# Patient Record
Sex: Female | Born: 1995 | Race: White | Hispanic: No | Marital: Single | Smoking: Never smoker
Health system: Southern US, Community
[De-identification: ages and names within clinical notes are randomized; demographics above are authoritative.]

## PROBLEM LIST (undated history)

## (undated) DIAGNOSIS — D649 Anemia, unspecified: Secondary | ICD-10-CM

## (undated) HISTORY — PX: OTHER SURGICAL HISTORY: SHX169

## (undated) HISTORY — DX: Anemia, unspecified: D64.9

---
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
## 2013-06-13 ENCOUNTER — Emergency Department (HOSPITAL_COMMUNITY)
Admission: EM | Admit: 2013-06-13 | Discharge: 2013-06-13 | Disposition: A | Discharge status: Home / Self Care | Payer: 59 | Source: Home / Self Care

## 2013-06-13 ENCOUNTER — Encounter (HOSPITAL_COMMUNITY): Payer: Self-pay | Admitting: Emergency Medicine

## 2013-06-13 DIAGNOSIS — J300 Vasomotor rhinitis: Secondary | ICD-10-CM

## 2013-06-13 DIAGNOSIS — J309 Allergic rhinitis, unspecified: Secondary | ICD-10-CM

## 2013-06-13 MED ORDER — FEXOFENADINE HCL 180 MG PO TABS: 180.0000 mg | ORAL_TABLET | Freq: Every day | ORAL | Status: DC

## 2013-06-13 MED ORDER — IPRATROPIUM BROMIDE 0.06 % NA SOLN: 2.0000 | Freq: Four times a day (QID) | NASAL | Status: DC

## 2013-06-13 NOTE — ED Provider Notes (Signed)
  CSN: [id number]     Arrival date & time 06/13/13  1457 History     None    Chief Complaint  Patient presents with  . URI    sore throat productive cough clear sputum. denies fever n/v/d   (Consider location/radiation/quality/duration/timing/severity/associated sxs/prior Treatment) Patient is a 17 y.o. female presenting with URI. The history is provided by the patient and a parent.  URI Presenting symptoms: congestion, cough, rhinorrhea and sore throat   Presenting symptoms: no fever   Severity:  Mild Onset quality:  Gradual Duration:  5 days Progression:  Unchanged Chronicity:  New Associated symptoms: no headaches, no sinus pain, no sneezing, no swollen glands and no wheezing   Risk factors: sick contacts     History reviewed. No pertinent past medical history. Past Surgical History  Procedure Laterality Date  . Tonsillectomy    . Adenoidectomy    . Arm surgery     History reviewed. No pertinent family history. History  Substance Use Topics  . Smoking status: Never Smoker   . Smokeless tobacco: Not on file  . Alcohol Use: No   OB History   Grav Para Term Preterm Abortions TAB SAB Ect Mult Living                 Review of Systems  Constitutional: Negative.  Negative for fever.  HENT: Positive for congestion, sore throat, rhinorrhea and postnasal drip. Negative for sneezing.   Respiratory: Positive for cough. Negative for shortness of breath and wheezing.   Cardiovascular: Negative.   Neurological: Negative for headaches.    Allergies  Review of patient's allergies indicates no known allergies.  Home Medications   Current Outpatient Rx  Name  Route  Sig  Dispense  Refill  . IRON CR PO   Oral   Take by mouth.          BP 111/63  Pulse 77  Temp(Src) 98.4 F (36.9 C) (Oral)  Resp 14  SpO2 99%  LMP 05/01/2013 Physical Exam  Nursing note and vitals reviewed. Constitutional: She is oriented to person, place, and time. She appears well-developed and  well-nourished.  HENT:  Head: Normocephalic.  Right Ear: External ear normal.  Left Ear: External ear normal.  Nose: Mucosal edema and rhinorrhea present.  Mouth/Throat: Oropharynx is clear and moist.  Neck: Normal range of motion. Neck supple.  Cardiovascular: Normal rate, normal heart sounds and intact distal pulses.   Pulmonary/Chest: Effort normal and breath sounds normal.  Lymphadenopathy:    She has no cervical adenopathy.  Neurological: She is alert and oriented to person, place, and time.  Skin: Skin is warm and dry.  Psychiatric: She has a normal mood and affect.    ED Course   Procedures (including critical care time)  Labs Reviewed - No data to display No results found. No diagnosis found.  MDM    Linna Hoff, MD 06/13/13 1537

## 2013-06-13 NOTE — ED Notes (Signed)
Pt reports sore throat that started on Wednesday morning. Productive cough with clear sputum that started several days before sore throat. Denies fever, n/v/d. Pt states that she was recently around family member that was sick. Pt denies sob chest pain. Pt is sitting up right no signs of respiratory distress.

## 2013-10-31 ENCOUNTER — Other Ambulatory Visit (HOSPITAL_COMMUNITY): Payer: Self-pay

## 2013-11-01 ENCOUNTER — Encounter (HOSPITAL_COMMUNITY)
Admission: RE | Admit: 2013-11-01 | Discharge: 2013-11-01 | Disposition: A | Discharge status: Home / Self Care | Payer: 59 | Source: Ambulatory Visit | Attending: Obstetrics and Gynecology | Admitting: Obstetrics and Gynecology

## 2013-11-01 MED ORDER — FERUMOXYTOL INJECTION 510 MG/17 ML
INTRAVENOUS | Status: AC
  Filled 2013-11-01: qty 17

## 2013-11-01 MED ORDER — FERUMOXYTOL INJECTION 510 MG/17 ML
510.0000 mg | Freq: Once | INTRAVENOUS | Status: AC
  Administered 2013-11-01: 510 mg via INTRAVENOUS

## 2013-11-04 ENCOUNTER — Encounter (HOSPITAL_COMMUNITY)
Admission: RE | Admit: 2013-11-04 | Discharge: 2013-11-04 | Disposition: A | Discharge status: Home / Self Care | Payer: 59 | Source: Ambulatory Visit | Attending: Obstetrics and Gynecology | Admitting: Obstetrics and Gynecology

## 2013-11-04 MED ORDER — FERUMOXYTOL INJECTION 510 MG/17 ML
510.0000 mg | Freq: Once | INTRAVENOUS | Status: AC
  Administered 2013-11-04: 510 mg via INTRAVENOUS

## 2013-11-04 MED ORDER — FERUMOXYTOL INJECTION 510 MG/17 ML
INTRAVENOUS | Status: AC
  Filled 2013-11-04: qty 17

## 2014-03-13 LAB — CBC AND DIFFERENTIAL: WBC: 5.3 10^3/mL

## 2015-02-14 ENCOUNTER — Telehealth: Payer: Self-pay | Admitting: Family

## 2015-02-14 DIAGNOSIS — G43809 Other migraine, not intractable, without status migrainosus: Secondary | ICD-10-CM

## 2015-02-14 NOTE — Progress Notes (Signed)
Based on what you shared with me it looks like you have a serious condition that should be evaluated in a face to face office visit.  If you are having a true medical emergency please call 911.  If you need an urgent face to face visit, [facility] has four urgent care centers for your convenience.  . [facility] a Provider at this Location  [address] [employer] [place], [place] [address] . 8 am to 8 pm Monday-Friday . 9 am to 7 pm Saturday-Sunday  . [facility] [place] a Provider at this Location  [address] [place], [place], [place] [address] . 8 am to 8 pm Monday-Friday . 9 am to 6 pm Saturday . 11 am to 6 pm Sunday   . [facility] [place] Get Driving Directions  [address] [place], [place] [address] . 8 am to 8 pm Monday-Friday . 9 am to 4 pm Saturday-Sunday   . [facility] (a walk in primary care provider)  [place] a Provider at this Location  [place],  [address] . 8 am to 8:30 pm Monday-Thursday . 8 am to 6 pm Friday . 8 am to 4 pm Saturday-Sunday   Your e-visit answers were reviewed by a board certified advanced clinical practitioner to complete your personal care plan.  Depending on the condition, your plan could have included both over the counter or prescription medications.  You will get an e-mail in the next two days asking about your experience.  I hope that your e-visit has been valuable and will speed your recovery . Thank you for choosing an e-visit.

## 2015-05-28 ENCOUNTER — Ambulatory Visit (INDEPENDENT_AMBULATORY_CARE_PROVIDER_SITE_OTHER): Payer: 59 | Admitting: Podiatry

## 2015-05-28 VITALS — BP 102/63 | HR 86 | Resp 16 | Ht 69.0 in | Wt 160.0 lb

## 2015-05-28 DIAGNOSIS — L600 Ingrowing nail: Secondary | ICD-10-CM

## 2015-05-28 LAB — HM PAP SMEAR

## 2015-05-28 MED ORDER — NEOMYCIN-POLYMYXIN-HC 3.5-10000-1 OT SOLN: OTIC | Status: DC

## 2015-05-28 NOTE — Patient Instructions (Signed)

## 2015-05-28 NOTE — Progress Notes (Signed)
   Subjective:    Patient ID: Holly Delgado, female    DOB: Nov 04, 1995, 19 y.o.   MRN: [id number]  HPI INGROWN TOENAIL RIGHT GREAT LATERAL CORNER. HAVE HAD IT GOING ON SINCE July 2 OR 3 , BEEN ON TWO ANTIBIOTICS AND HAVE SOAKED IT .    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 Chief complaint of a painful ingrown toenail fibular border of the hallux right that has been present since the beginning of July. She has tried 2 rounds of antibiotics. She states it is no better.  I have reviewed her past medical history medications allergies surgery social history and review of systems. Pulses are strongly palpable bilaterally neurologic sensorium is intact bilateral D tendon reflexes are brisk and equal bilateral as our muscle strength +5 over 5 dorsiflexion plantar flexors and inverters everters all edges of musculature is intact. Orthopedic evaluation due to straights all joints distal to the ankle for range of motion without crepitation. Cutaneous evaluation demonstrates supple well-hydrated cutis no erythema edema saline as drainage or odor with exception of the fibular border of the hallux right with a sharp incurvated nail margin granulation tissue purulence and malodor.        Assessment & Plan:  Assessment: Ingrown nail paronychia abscess hallux right.  Plan: Discussed etiology pathology conservative versus surgical therapies. Chemical destruction was performed along the fibular border of the hallux right which she tolerated well after local anesthesia was administered. After local anesthesia was performed matrixectomy was performed of which she tolerated this procedure well. She will start soaking in Betadine and warm water starting tomorrow and apply Cortisporin otic drops for which she was given a prescription. She was given both oral and written home-going instructions for the care and soaking  of the foot I will follow-up with her in 1 week.

## 2015-06-04 ENCOUNTER — Ambulatory Visit (INDEPENDENT_AMBULATORY_CARE_PROVIDER_SITE_OTHER): Payer: 59 | Admitting: Podiatry

## 2015-06-04 ENCOUNTER — Encounter: Payer: Self-pay | Admitting: Podiatry

## 2015-06-04 DIAGNOSIS — L03011 Cellulitis of right finger: Secondary | ICD-10-CM

## 2015-06-04 DIAGNOSIS — L03031 Cellulitis of right toe: Secondary | ICD-10-CM

## 2015-06-04 DIAGNOSIS — L600 Ingrowing nail: Secondary | ICD-10-CM

## 2015-06-04 MED ORDER — CEPHALEXIN 500 MG PO CAPS: 500.0000 mg | ORAL_CAPSULE | Freq: Three times a day (TID) | ORAL | Status: DC

## 2015-06-04 NOTE — Patient Instructions (Signed)

## 2015-06-04 NOTE — Progress Notes (Signed)
She presents today for follow-up of matrixectomy fibular border of the hallux right. She states this seems to be doing pretty good and feels better than it did previously. She states that she's been soaking on a regular basis. She does relate changing apartments this weekend.  Objective: Vital signs are stable she is alert and oriented 3. The hallux appears to be slightly reddened with some granulation tissue along the fibular border of the hallux right. It is nontender on palpation however. I see no purulence and I smell no odor.  Assessment: Mild paronychia status post matrixectomy hallux right.  Plan: Discussed etiology pathology conservative versus surgical therapies. At this point we wrote another prescription for Keflex 500 mg 1 by mouth 3 times a day. She will discontinue Betadine spell with Epsom salts and warm water soaks covered during the day and leave open at night. I will to follow up with her to make sure this is healing well in approximately 3 weeks.

## 2015-06-25 ENCOUNTER — Encounter: Payer: Self-pay | Admitting: Podiatry

## 2015-06-25 ENCOUNTER — Ambulatory Visit (INDEPENDENT_AMBULATORY_CARE_PROVIDER_SITE_OTHER): Payer: 59 | Admitting: Podiatry

## 2015-06-25 VITALS — BP 121/80 | HR 100 | Resp 12

## 2015-06-25 DIAGNOSIS — L03031 Cellulitis of right toe: Secondary | ICD-10-CM | POA: Diagnosis not present

## 2015-06-25 DIAGNOSIS — L600 Ingrowing nail: Secondary | ICD-10-CM

## 2015-06-25 DIAGNOSIS — L03011 Cellulitis of right finger: Secondary | ICD-10-CM

## 2015-06-25 NOTE — Progress Notes (Signed)
She presents today for follow-up of the hallux matrixectomy right. She states that she just returned from [place] where she was not able to soak the toe regularly. She states that it seems to be doing a little better.  Objective: Vital signs are stable alert and oriented 3. Minimal edema no erythema cellulitis drainage or odor. Eschar is present along the fibular border pulses are palpable and neurologic sensorium is intact.  Assessment: Slow healing matrixectomy hallux right secondary to noncompliance.  Plan: Continue the use of Epsom salts and warm water along with the prescribed Cortisporin Otic until the wound is completely healed. We will to see no erythema no drainage and no pain. She will cover during the day and leave open at night. Follow up with me as needed. She will watch for signs and symptoms of worsening infection notify as with questions or concerns.  Roselind Messier DPM

## 2016-01-10 ENCOUNTER — Ambulatory Visit: Payer: 59 | Admitting: Family Medicine

## 2016-01-11 MED FILL — LO LOESTRIN FE 1-10 TABLET: 1 MG-10 MCG | 84 days supply | Qty: 84 | Fill #0 | Status: TO

## 2016-02-14 MED FILL — FERRAPLUS 90 TABLET: 90-1 | 90 days supply | Qty: 90 | Fill #2

## 2016-02-15 ENCOUNTER — Ambulatory Visit: Payer: 59 | Admitting: Family Medicine

## 2016-03-25 DIAGNOSIS — Z79899 Other long term (current) drug therapy: Secondary | ICD-10-CM | POA: Diagnosis not present

## 2016-03-25 DIAGNOSIS — F419 Anxiety disorder, unspecified: Secondary | ICD-10-CM | POA: Diagnosis not present

## 2016-03-25 DIAGNOSIS — F902 Attention-deficit hyperactivity disorder, combined type: Secondary | ICD-10-CM | POA: Diagnosis not present

## 2016-03-25 MED FILL — VYVANSE 50 MG CAPSULE: 50 | 90 days supply | Qty: 90 | Fill #0

## 2016-03-27 ENCOUNTER — Ambulatory Visit (INDEPENDENT_AMBULATORY_CARE_PROVIDER_SITE_OTHER): Payer: 59 | Admitting: Family Medicine

## 2016-03-27 ENCOUNTER — Encounter: Payer: Self-pay | Admitting: Family Medicine

## 2016-03-27 VITALS — BP 98/62 | HR 98 | Temp 98.3°F | Ht 68.31 in | Wt 171.0 lb

## 2016-03-27 DIAGNOSIS — Z0000 Encounter for general adult medical examination without abnormal findings: Secondary | ICD-10-CM

## 2016-03-27 DIAGNOSIS — D649 Anemia, unspecified: Secondary | ICD-10-CM | POA: Insufficient documentation

## 2016-03-27 MED FILL — LO LOESTRIN FE 1-10 TABLET: 1 MG-10 MCG | 84 days supply | Qty: 84 | Fill #0

## 2016-03-27 NOTE — Patient Instructions (Signed)
Preventive Care for Adults, Female A healthy lifestyle and preventive care can promote health and wellness. Preventive health guidelines for women include the following key practices.  A routine yearly physical is a good way to check with your health care provider about your health and preventive screening. It is a chance to share any concerns and updates on your health and to receive a thorough exam.  Visit your dentist for a routine exam and preventive care every 6 months. Brush your teeth twice a day and floss once a day. Good oral hygiene prevents tooth decay and gum disease.  The frequency of eye exams is based on your age, health, family medical history, use of contact lenses, and other factors. Follow your health care provider's recommendations for frequency of eye exams.  Eat a healthy diet. Foods like vegetables, fruits, whole grains, low-fat dairy products, and lean protein foods contain the nutrients you need without too many calories. Decrease your intake of foods high in solid fats, added sugars, and salt. Eat the right amount of calories for you.Get information about a proper diet from your health care provider, if necessary.  Regular physical exercise is one of the most important things you can do for your health. Most adults should get at least 150 minutes of moderate-intensity exercise (any activity that increases your heart rate and causes you to sweat) each week. In addition, most adults need muscle-strengthening exercises on 2 or more days a week.  Maintain a healthy weight. The body mass index (BMI) is a screening tool to identify possible weight problems. It provides an estimate of body fat based on height and weight. Your health care provider can find your BMI and can help you achieve or maintain a healthy weight.For adults 20 years and older:  A BMI below 18.5 is considered underweight.  A BMI of 18.5 to 24.9 is normal.  A BMI of 25 to 29.9 is considered overweight.  A  BMI of 30 and above is considered obese.  Maintain normal blood lipids and cholesterol levels by exercising and minimizing your intake of saturated fat. Eat a balanced diet with plenty of fruit and vegetables. Blood tests for lipids and cholesterol should begin at age 45 and be repeated every 5 years. If your lipid or cholesterol levels are high, you are over 50, or you are at high risk for heart disease, you may need your cholesterol levels checked more frequently.Ongoing high lipid and cholesterol levels should be treated with medicines if diet and exercise are not working.  If you smoke, find out from your health care provider how to quit. If you do not use tobacco, do not start.  Lung cancer screening is recommended for adults aged 45-80 years who are at high risk for developing lung cancer because of a history of smoking. A yearly low-dose CT scan of the lungs is recommended for people who have at least a 30-pack-year history of smoking and are a current smoker or have quit within the past 15 years. A pack year of smoking is smoking an average of 1 pack of cigarettes a day for 1 year (for example: 1 pack a day for 30 years or 2 packs a day for 15 years). Yearly screening should continue until the smoker has stopped smoking for at least 15 years. Yearly screening should be stopped for people who develop a health problem that would prevent them from having lung cancer treatment.  If you are pregnant, do not drink alcohol. If you are  breastfeeding, be very cautious about drinking alcohol. If you are not pregnant and choose to drink alcohol, do not have more than 1 drink per day. One drink is considered to be 12 ounces (355 mL) of beer, 5 ounces (148 mL) of wine, or 1.5 ounces (44 mL) of liquor.  Avoid use of street drugs. Do not share needles with anyone. Ask for help if you need support or instructions about stopping the use of drugs.  High blood pressure causes heart disease and increases the risk  of stroke. Your blood pressure should be checked at least every 1 to 2 years. Ongoing high blood pressure should be treated with medicines if weight loss and exercise do not work.  If you are 55-79 years old, ask your health care provider if you should take aspirin to prevent strokes.  Diabetes screening is done by taking a blood sample to check your blood glucose level after you have not eaten for a certain period of time (fasting). If you are not overweight and you do not have risk factors for diabetes, you should be screened once every 3 years starting at age 45. If you are overweight or obese and you are 40-70 years of age, you should be screened for diabetes every year as part of your cardiovascular risk assessment.  Breast cancer screening is essential preventive care for women. You should practice "breast self-awareness." This means understanding the normal appearance and feel of your breasts and may include breast self-examination. Any changes detected, no matter how small, should be reported to a health care provider. Women in their 20s and 30s should have a clinical breast exam (CBE) by a health care provider as part of a regular health exam every 1 to 3 years. After age 40, women should have a CBE every year. Starting at age 40, women should consider having a mammogram (breast X-ray test) every year. Women who have a family history of breast cancer should talk to their health care provider about genetic screening. Women at a high risk of breast cancer should talk to their health care providers about having an MRI and a mammogram every year.  Breast cancer gene (BRCA)-related cancer risk assessment is recommended for women who have family members with BRCA-related cancers. BRCA-related cancers include breast, ovarian, tubal, and peritoneal cancers. Having family members with these cancers may be associated with an increased risk for harmful changes (mutations) in the breast cancer genes BRCA1 and  BRCA2. Results of the assessment will determine the need for genetic counseling and BRCA1 and BRCA2 testing.  Your health care provider may recommend that you be screened regularly for cancer of the pelvic organs (ovaries, uterus, and vagina). This screening involves a pelvic examination, including checking for microscopic changes to the surface of your cervix (Pap test). You may be encouraged to have this screening done every 3 years, beginning at age 21.  For women ages 30-65, health care providers may recommend pelvic exams and Pap testing every 3 years, or they may recommend the Pap and pelvic exam, combined with testing for human papilloma virus (HPV), every 5 years. Some types of HPV increase your risk of cervical cancer. Testing for HPV may also be done on women of any age with unclear Pap test results.  Other health care providers may not recommend any screening for nonpregnant women who are considered low risk for pelvic cancer and who do not have symptoms. Ask your health care provider if a screening pelvic exam is right for   you.  If you have had past treatment for cervical cancer or a condition that could lead to cancer, you need Pap tests and screening for cancer for at least 20 years after your treatment. If Pap tests have been discontinued, your risk factors (such as having a new sexual partner) need to be reassessed to determine if screening should resume. Some women have medical problems that increase the chance of getting cervical cancer. In these cases, your health care provider may recommend more frequent screening and Pap tests.  Colorectal cancer can be detected and often prevented. Most routine colorectal cancer screening begins at the age of 50 years and continues through age 75 years. However, your health care provider may recommend screening at an earlier age if you have risk factors for colon cancer. On a yearly basis, your health care provider may provide home test kits to check  for hidden blood in the stool. Use of a small camera at the end of a tube, to directly examine the colon (sigmoidoscopy or colonoscopy), can detect the earliest forms of colorectal cancer. Talk to your health care provider about this at age 50, when routine screening begins. Direct exam of the colon should be repeated every 5-10 years through age 75 years, unless early forms of precancerous polyps or small growths are found.  People who are at an increased risk for hepatitis B should be screened for this virus. You are considered at high risk for hepatitis B if:  You were born in a country where hepatitis B occurs often. Talk with your health care provider about which countries are considered high risk.  Your parents were born in a high-risk country and you have not received a shot to protect against hepatitis B (hepatitis B vaccine).  You have HIV or AIDS.  You use needles to inject street drugs.  You live with, or have sex with, someone who has hepatitis B.  You get hemodialysis treatment.  You take certain medicines for conditions like cancer, organ transplantation, and autoimmune conditions.  Hepatitis C blood testing is recommended for all people born from 1945 through 1965 and any individual with known risks for hepatitis C.  Practice safe sex. Use condoms and avoid high-risk sexual practices to reduce the spread of sexually transmitted infections (STIs). STIs include gonorrhea, chlamydia, syphilis, trichomonas, herpes, HPV, and human immunodeficiency virus (HIV). Herpes, HIV, and HPV are viral illnesses that have no cure. They can result in disability, cancer, and death.  You should be screened for sexually transmitted illnesses (STIs) including gonorrhea and chlamydia if:  You are sexually active and are younger than 24 years.  You are older than 24 years and your health care provider tells you that you are at risk for this type of infection.  Your sexual activity has changed  since you were last screened and you are at an increased risk for chlamydia or gonorrhea. Ask your health care provider if you are at risk.  If you are at risk of being infected with HIV, it is recommended that you take a prescription medicine daily to prevent HIV infection. This is called preexposure prophylaxis (PrEP). You are considered at risk if:  You are sexually active and do not regularly use condoms or know the HIV status of your partner(s).  You take drugs by injection.  You are sexually active with a partner who has HIV.  Talk with your health care provider about whether you are at high risk of being infected with HIV. If   you choose to begin PrEP, you should first be tested for HIV. You should then be tested every 3 months for as long as you are taking PrEP.  Osteoporosis is a disease in which the bones lose minerals and strength with aging. This can result in serious bone fractures or breaks. The risk of osteoporosis can be identified using a bone density scan. Women ages 67 years and over and women at risk for fractures or osteoporosis should discuss screening with their health care providers. Ask your health care provider whether you should take a calcium supplement or vitamin D to reduce the rate of osteoporosis.  Menopause can be associated with physical symptoms and risks. Hormone replacement therapy is available to decrease symptoms and risks. You should talk to your health care provider about whether hormone replacement therapy is right for you.  Use sunscreen. Apply sunscreen liberally and repeatedly throughout the day. You should seek shade when your shadow is shorter than you. Protect yourself by wearing long sleeves, pants, a wide-brimmed hat, and sunglasses year round, whenever you are outdoors.  Once a month, do a whole body skin exam, using a mirror to look at the skin on your back. Tell your health care provider of new moles, moles that have irregular borders, moles that  are larger than a pencil eraser, or moles that have changed in shape or color.  Stay current with required vaccines (immunizations).  Influenza vaccine. All adults should be immunized every year.  Tetanus, diphtheria, and acellular pertussis (Td, Tdap) vaccine. Pregnant women should receive 1 dose of Tdap vaccine during each pregnancy. The dose should be obtained regardless of the length of time since the last dose. Immunization is preferred during the 27th-36th week of gestation. An adult who has not previously received Tdap or who does not know her vaccine status should receive 1 dose of Tdap. This initial dose should be followed by tetanus and diphtheria toxoids (Td) booster doses every 10 years. Adults with an unknown or incomplete history of completing a 3-dose immunization series with Td-containing vaccines should begin or complete a primary immunization series including a Tdap dose. Adults should receive a Td booster every 10 years.  Varicella vaccine. An adult without evidence of immunity to varicella should receive 2 doses or a second dose if she has previously received 1 dose. Pregnant females who do not have evidence of immunity should receive the first dose after pregnancy. This first dose should be obtained before leaving the health care facility. The second dose should be obtained 4-8 weeks after the first dose.  Human papillomavirus (HPV) vaccine. Females aged 13-26 years who have not received the vaccine previously should obtain the 3-dose series. The vaccine is not recommended for use in pregnant females. However, pregnancy testing is not needed before receiving a dose. If a female is found to be pregnant after receiving a dose, no treatment is needed. In that case, the remaining doses should be delayed until after the pregnancy. Immunization is recommended for any person with an immunocompromised condition through the age of 61 years if she did not get any or all doses earlier. During the  3-dose series, the second dose should be obtained 4-8 weeks after the first dose. The third dose should be obtained 24 weeks after the first dose and 16 weeks after the second dose.  Zoster vaccine. One dose is recommended for adults aged 30 years or older unless certain conditions are present.  Measles, mumps, and rubella (MMR) vaccine. Adults born  before 1957 generally are considered immune to measles and mumps. Adults born in 1957 or later should have 1 or more doses of MMR vaccine unless there is a contraindication to the vaccine or there is laboratory evidence of immunity to each of the three diseases. A routine second dose of MMR vaccine should be obtained at least 28 days after the first dose for students attending postsecondary schools, health care workers, or international travelers. People who received inactivated measles vaccine or an unknown type of measles vaccine during 1963-1967 should receive 2 doses of MMR vaccine. People who received inactivated mumps vaccine or an unknown type of mumps vaccine before 1979 and are at high risk for mumps infection should consider immunization with 2 doses of MMR vaccine. For females of childbearing age, rubella immunity should be determined. If there is no evidence of immunity, females who are not pregnant should be vaccinated. If there is no evidence of immunity, females who are pregnant should delay immunization until after pregnancy. Unvaccinated health care workers born before 1957 who lack laboratory evidence of measles, mumps, or rubella immunity or laboratory confirmation of disease should consider measles and mumps immunization with 2 doses of MMR vaccine or rubella immunization with 1 dose of MMR vaccine.  Pneumococcal 13-valent conjugate (PCV13) vaccine. When indicated, a person who is uncertain of his immunization history and has no record of immunization should receive the PCV13 vaccine. All adults 65 years of age and older should receive this  vaccine. An adult aged 19 years or older who has certain medical conditions and has not been previously immunized should receive 1 dose of PCV13 vaccine. This PCV13 should be followed with a dose of pneumococcal polysaccharide (PPSV23) vaccine. Adults who are at high risk for pneumococcal disease should obtain the PPSV23 vaccine at least 8 weeks after the dose of PCV13 vaccine. Adults older than 22 years of age who have normal immune system function should obtain the PPSV23 vaccine dose at least 1 year after the dose of PCV13 vaccine.  Pneumococcal polysaccharide (PPSV23) vaccine. When PCV13 is also indicated, PCV13 should be obtained first. All adults aged 65 years and older should be immunized. An adult younger than age 65 years who has certain medical conditions should be immunized. Any person who resides in a nursing home or long-term care facility should be immunized. An adult smoker should be immunized. People with an immunocompromised condition and certain other conditions should receive both PCV13 and PPSV23 vaccines. People with human immunodeficiency virus (HIV) infection should be immunized as soon as possible after diagnosis. Immunization during chemotherapy or radiation therapy should be avoided. Routine use of PPSV23 vaccine is not recommended for American Indians, [place] Natives, or people younger than 65 years unless there are medical conditions that require PPSV23 vaccine. When indicated, people who have unknown immunization and have no record of immunization should receive PPSV23 vaccine. One-time revaccination 5 years after the first dose of PPSV23 is recommended for people aged 19-64 years who have chronic kidney failure, nephrotic syndrome, asplenia, or immunocompromised conditions. People who received 1-2 doses of PPSV23 before age 65 years should receive another dose of PPSV23 vaccine at age 65 years or later if at least 5 years have passed since the previous dose. Doses of PPSV23 are not  needed for people immunized with PPSV23 at or after age 65 years.  Meningococcal vaccine. Adults with asplenia or persistent complement component deficiencies should receive 2 doses of quadrivalent meningococcal conjugate (MenACWY-D) vaccine. The doses should be obtained   at least 2 months apart. Microbiologists working with certain meningococcal bacteria, [place] recruits, people at risk during an outbreak, and people who travel to or live in countries with a high rate of meningitis should be immunized. A first-year college student up through age 34 years who is living in a residence hall should receive a dose if she did not receive a dose on or after her 16th birthday. Adults who have certain high-risk conditions should receive one or more doses of vaccine.  Hepatitis A vaccine. Adults who wish to be protected from this disease, have certain high-risk conditions, work with hepatitis A-infected animals, work in hepatitis A research labs, or travel to or work in countries with a high rate of hepatitis A should be immunized. Adults who were previously unvaccinated and who anticipate close contact with an international adoptee during the first 60 days after arrival in the [place] from a country with a high rate of hepatitis A should be immunized.  Hepatitis B vaccine. Adults who wish to be protected from this disease, have certain high-risk conditions, may be exposed to blood or other infectious body fluids, are household contacts or sex partners of hepatitis B positive people, are clients or workers in certain care facilities, or travel to or work in countries with a high rate of hepatitis B should be immunized.  Haemophilus influenzae type b (Hib) vaccine. A previously unvaccinated person with asplenia or sickle cell disease or having a scheduled splenectomy should receive 1 dose of Hib vaccine. Regardless of previous immunization, a recipient of a hematopoietic stem cell transplant should receive a  3-dose series 6-12 months after her successful transplant. Hib vaccine is not recommended for adults with HIV infection. Preventive Services / Frequency Ages 35 to 4 years  Blood pressure check.** / Every 3-5 years.  Lipid and cholesterol check.** / Every 5 years beginning at age 60.  Clinical breast exam.** / Every 3 years for women in their 71s and 10s.  BRCA-related cancer risk assessment.** / For women who have family members with a BRCA-related cancer (breast, ovarian, tubal, or peritoneal cancers).  Pap test.** / Every 2 years from ages 76 through 26. Every 3 years starting at age 61 through age 76 or 93 with a history of 3 consecutive normal Pap tests.  HPV screening.** / Every 3 years from ages 37 through ages 60 to 51 with a history of 3 consecutive normal Pap tests.  Hepatitis C blood test.** / For any individual with known risks for hepatitis C.  Skin self-exam. / Monthly.  Influenza vaccine. / Every year.  Tetanus, diphtheria, and acellular pertussis (Tdap, Td) vaccine.** / Consult your health care provider. Pregnant women should receive 1 dose of Tdap vaccine during each pregnancy. 1 dose of Td every 10 years.  Varicella vaccine.** / Consult your health care provider. Pregnant females who do not have evidence of immunity should receive the first dose after pregnancy.  HPV vaccine. / 3 doses over 6 months, if 93 and younger. The vaccine is not recommended for use in pregnant females. However, pregnancy testing is not needed before receiving a dose.  Measles, mumps, rubella (MMR) vaccine.** / You need at least 1 dose of MMR if you were born in 1957 or later. You may also need a 2nd dose. For females of childbearing age, rubella immunity should be determined. If there is no evidence of immunity, females who are not pregnant should be vaccinated. If there is no evidence of immunity, females who are  pregnant should delay immunization until after pregnancy.  Pneumococcal  13-valent conjugate (PCV13) vaccine.** / Consult your health care provider.  Pneumococcal polysaccharide (PPSV23) vaccine.** / 1 to 2 doses if you smoke cigarettes or if you have certain conditions.  Meningococcal vaccine.** / 1 dose if you are age 68 to 8 years and a Market researcher living in a residence hall, or have one of several medical conditions, you need to get vaccinated against meningococcal disease. You may also need additional booster doses.  Hepatitis A vaccine.** / Consult your health care provider.  Hepatitis B vaccine.** / Consult your health care provider.  Haemophilus influenzae type b (Hib) vaccine.** / Consult your health care provider. Ages 7 to 53 years  Blood pressure check.** / Every year.  Lipid and cholesterol check.** / Every 5 years beginning at age 25 years.  Lung cancer screening. / Every year if you are aged 11-80 years and have a 30-pack-year history of smoking and currently smoke or have quit within the past 15 years. Yearly screening is stopped once you have quit smoking for at least 15 years or develop a health problem that would prevent you from having lung cancer treatment.  Clinical breast exam.** / Every year after age 48 years.  BRCA-related cancer risk assessment.** / For women who have family members with a BRCA-related cancer (breast, ovarian, tubal, or peritoneal cancers).  Mammogram.** / Every year beginning at age 41 years and continuing for as long as you are in good health. Consult with your health care provider.  Pap test.** / Every 3 years starting at age 65 years through age 37 or 70 years with a history of 3 consecutive normal Pap tests.  HPV screening.** / Every 3 years from ages 72 years through ages 60 to 40 years with a history of 3 consecutive normal Pap tests.  Fecal occult blood test (FOBT) of stool. / Every year beginning at age 21 years and continuing until age 5 years. You may not need to do this test if you get  a colonoscopy every 10 years.  Flexible sigmoidoscopy or colonoscopy.** / Every 5 years for a flexible sigmoidoscopy or every 10 years for a colonoscopy beginning at age 35 years and continuing until age 48 years.  Hepatitis C blood test.** / For all people born from [birth year] through 1965 and any individual with known risks for hepatitis C.  Skin self-exam. / Monthly.  Influenza vaccine. / Every year.  Tetanus, diphtheria, and acellular pertussis (Tdap/Td) vaccine.** / Consult your health care provider. Pregnant women should receive 1 dose of Tdap vaccine during each pregnancy. 1 dose of Td every 10 years.  Varicella vaccine.** / Consult your health care provider. Pregnant females who do not have evidence of immunity should receive the first dose after pregnancy.  Zoster vaccine.** / 1 dose for adults aged 30 years or older.  Measles, mumps, rubella (MMR) vaccine.** / You need at least 1 dose of MMR if you were born in 1957 or later. You may also need a second dose. For females of childbearing age, rubella immunity should be determined. If there is no evidence of immunity, females who are not pregnant should be vaccinated. If there is no evidence of immunity, females who are pregnant should delay immunization until after pregnancy.  Pneumococcal 13-valent conjugate (PCV13) vaccine.** / Consult your health care provider.  Pneumococcal polysaccharide (PPSV23) vaccine.** / 1 to 2 doses if you smoke cigarettes or if you have certain conditions.  Meningococcal vaccine.** /  Consult your health care provider.  Hepatitis A vaccine.** / Consult your health care provider.  Hepatitis B vaccine.** / Consult your health care provider.  Haemophilus influenzae type b (Hib) vaccine.** / Consult your health care provider. Ages 64 years and over  Blood pressure check.** / Every year.  Lipid and cholesterol check.** / Every 5 years beginning at age 23 years.  Lung cancer screening. / Every year if you  are aged 16-80 years and have a 30-pack-year history of smoking and currently smoke or have quit within the past 15 years. Yearly screening is stopped once you have quit smoking for at least 15 years or develop a health problem that would prevent you from having lung cancer treatment.  Clinical breast exam.** / Every year after age 74 years.  BRCA-related cancer risk assessment.** / For women who have family members with a BRCA-related cancer (breast, ovarian, tubal, or peritoneal cancers).  Mammogram.** / Every year beginning at age 44 years and continuing for as long as you are in good health. Consult with your health care provider.  Pap test.** / Every 3 years starting at age 58 years through age 22 or 39 years with 3 consecutive normal Pap tests. Testing can be stopped between 65 and 70 years with 3 consecutive normal Pap tests and no abnormal Pap or HPV tests in the past 10 years.  HPV screening.** / Every 3 years from ages 64 years through ages 70 or 61 years with a history of 3 consecutive normal Pap tests. Testing can be stopped between 65 and 70 years with 3 consecutive normal Pap tests and no abnormal Pap or HPV tests in the past 10 years.  Fecal occult blood test (FOBT) of stool. / Every year beginning at age 40 years and continuing until age 27 years. You may not need to do this test if you get a colonoscopy every 10 years.  Flexible sigmoidoscopy or colonoscopy.** / Every 5 years for a flexible sigmoidoscopy or every 10 years for a colonoscopy beginning at age 7 years and continuing until age 32 years.  Hepatitis C blood test.** / For all people born from [birth year] through 1965 and any individual with known risks for hepatitis C.  Osteoporosis screening.** / A one-time screening for women ages 30 years and over and women at risk for fractures or osteoporosis.  Skin self-exam. / Monthly.  Influenza vaccine. / Every year.  Tetanus, diphtheria, and acellular pertussis (Tdap/Td)  vaccine.** / 1 dose of Td every 10 years.  Varicella vaccine.** / Consult your health care provider.  Zoster vaccine.** / 1 dose for adults aged 35 years or older.  Pneumococcal 13-valent conjugate (PCV13) vaccine.** / Consult your health care provider.  Pneumococcal polysaccharide (PPSV23) vaccine.** / 1 dose for all adults aged 46 years and older.  Meningococcal vaccine.** / Consult your health care provider.  Hepatitis A vaccine.** / Consult your health care provider.  Hepatitis B vaccine.** / Consult your health care provider.  Haemophilus influenzae type b (Hib) vaccine.** / Consult your health care provider. ** Family history and personal history of risk and conditions may change your health care provider's recommendations.   This information is not intended to replace advice given to you by your health care provider. Make sure you discuss any questions you have with your health care provider.   Document Released: 12/16/2001 Document Revised: 11/10/2014 Document Reviewed: 03/17/2011 Elsevier Interactive Patient Education [employer].

## 2016-03-27 NOTE — Progress Notes (Signed)
Phone: [phone]  Subjective:  Patient presents today to establish care. Chief complaint-noted.   See problem oriented charting  The following were reviewed and entered/updated in epic: Past Medical History  Diagnosis Date  . Anemia     improved on birth control   Patient Active Problem List   Diagnosis Date Noted  . Anemia    Past Surgical History  Procedure Laterality Date  . Tonsillectomy  2001  . Arm surgery      2006 and 2007- rods after fx  . Wisdom tooth extraction  2013    Family History  Problem Relation Age of Onset  . Thyroid cancer Mother   . Polycystic ovary syndrome Sister   . Breast cancer Maternal Grandmother     Medications- reviewed and updated Current Outpatient Prescriptions  Medication Sig Dispense Refill  . Iron-Folic 99991111 (FERRAPLUS 90) 90-1 MG TABS Take 1 tablet by mouth daily.  3  . Norethindrone-Ethinyl Estradiol-Fe Biphas (LO LOESTRIN FE) 1 MG-10 MCG / 10 MCG tablet Take 1 tablet by mouth daily.     No current facility-administered medications for this visit.    Allergies-reviewed and updated No Known Allergies  Social History   Social History  . Marital Status: Single    Spouse Name: N/A  . Number of Children: N/A  . Years of Education: N/A   Social History Main Topics  . Smoking status: Never Smoker   . Smokeless tobacco: Never Used  . Alcohol Use: 0.6 - 1.2 oz/week    1-2 Standard drinks or equivalent per week  . Drug Use: No  . Sexual Activity: Yes    Birth Control/ Protection: Pill   Other Topics Concern  . None   Social History Narrative   Parents are patients at [employer]- unclear which physician.    Single/dating. Lives with roommate in [place] then moving back home.       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  HENT: Negative for hearing loss.   Eyes: Negative for blurred vision and double vision.  Respiratory: Negative for cough and hemoptysis.   Cardiovascular: Negative for chest pain and palpitations.  Gastrointestinal: Negative for heartburn and nausea.  Genitourinary: Negative for dysuria and urgency.  Musculoskeletal: Negative for myalgias and neck pain.  Skin: Negative for itching and rash.  Neurological: Negative for dizziness, tingling and headaches.  Endo/Heme/Allergies: Negative for polydipsia. Does not bruise/bleed easily.  Psychiatric/Behavioral: Negative for substance abuse. The patient is not nervous/anxious.    Objective: BP 98/62 mmHg  Pulse 98  Temp(Src) 98.3 F (36.8 C) (Oral)  Ht 5' 8.31" (1.735 m)  Wt 171 lb (77.565 kg)  BMI 25.77 kg/m2  SpO2 98%  LMP 03/08/2016 (Exact Date) Gen: NAD, resting comfortably HEENT: Mucous membranes are moist. Oropharynx normal. TM normal. Eyes: sclera and lids normal, PERRLA Neck: no thyromegaly, no cervical lymphadenopathy CV: RRR no murmurs rubs or gallops Lungs: CTAB no crackles, wheeze, rhonchi Abdomen: soft/nontender/nondistended/normal bowel sounds. No rebound or guarding.  Ext: no edema Skin: warm, dry Neuro: 5/5 strength in upper and lower extremities, normal gait, normal reflexes  Assessment/Plan:  20 y.o. female presenting for annual physical.  Health Maintenance counseling: 1. Anticipatory guidance: Patient counseled regarding regular dental exams, eye exams- wears contacts, wearing seatbelts.  2. Risk factor reduction:  Advised patient of need for regular exercise and diet rich and fruits and vegetables to reduce risk of heart attack and stroke. Working on 3x a week exercise. Weight up about 11 lbs in last few years- working towards 160 again.  3. Immunizations/screenings/ancillary  studies- getting pap smear, august 2008 Tdap- give Tdap next year.  4. Cervical cancer screening- august 2016 normal, get records 5. Breast cancer screening-  Start at age 27 with MGM history 6. Colon cancer screening - start at age 76 7. Skin cancer screening- no concerning lesions 8. STD screening- may have had with GYN- getting records  ROI- take out labs if done at GYN, look for pap smear.   Return in about 1 year (around 03/27/2017) for physical. Return precautions advised.   Orders Placed This Encounter  Procedures  . CBC    Standing Status: Future     Number of Occurrences:      Standing Expiration Date: 03/27/2017  . Comprehensive metabolic panel    [facility]    Standing Status: Future     Number of Occurrences:      Standing Expiration Date: 03/27/2017  . Lipid panel    Standing Status: Future     Number of Occurrences:      Standing Expiration Date: 03/27/2017  . TSH    Standing Status: Future     Number of Occurrences:      Standing Expiration Date: 03/27/2017  . HIV antibody    Standing Status: Future     Number of Occurrences:      Standing Expiration Date: 03/27/2017  . RPR    [facility]    Standing Status: Future     Number of Occurrences:      Standing Expiration Date: 03/27/2017    Meds ordered this encounter  Medications  . Iron-Folic 99991111 (FERRAPLUS 90) 90-1 MG TABS    Sig: Take 1 tablet by mouth daily.    Refill:  3  . Norethindrone-Ethinyl Estradiol-Fe Biphas (LO LOESTRIN FE) 1 MG-10 MCG / 10 MCG tablet    Sig: Take 1 tablet by mouth daily.    Garret Reddish, MD

## 2016-04-11 ENCOUNTER — Ambulatory Visit (INDEPENDENT_AMBULATORY_CARE_PROVIDER_SITE_OTHER): Payer: 59 | Admitting: Sports Medicine

## 2016-04-11 ENCOUNTER — Other Ambulatory Visit (HOSPITAL_COMMUNITY)
Admission: RE | Admit: 2016-04-11 | Discharge: 2016-04-11 | Disposition: A | Discharge status: Home / Self Care | Payer: 59 | Source: Ambulatory Visit | Attending: Family Medicine | Admitting: Family Medicine

## 2016-04-11 ENCOUNTER — Other Ambulatory Visit (INDEPENDENT_AMBULATORY_CARE_PROVIDER_SITE_OTHER): Payer: 59

## 2016-04-11 ENCOUNTER — Encounter: Payer: Self-pay | Admitting: Sports Medicine

## 2016-04-11 VITALS — BP 111/60 | HR 81 | Ht 69.0 in | Wt 165.0 lb

## 2016-04-11 DIAGNOSIS — Z113 Encounter for screening for infections with a predominantly sexual mode of transmission: Secondary | ICD-10-CM | POA: Diagnosis not present

## 2016-04-11 DIAGNOSIS — N760 Acute vaginitis: Secondary | ICD-10-CM | POA: Diagnosis not present

## 2016-04-11 DIAGNOSIS — Z0000 Encounter for general adult medical examination without abnormal findings: Secondary | ICD-10-CM

## 2016-04-11 DIAGNOSIS — M25561 Pain in right knee: Secondary | ICD-10-CM | POA: Insufficient documentation

## 2016-04-11 LAB — COMPREHENSIVE METABOLIC PANEL
ALBUMIN: 4.4 g/dL (ref 3.5–5.2)
ALT: 12 U/L (ref 0–35)
AST: 17 U/L (ref 0–37)
Alkaline Phosphatase: 44 U/L (ref 39–117)
BILIRUBIN TOTAL: 0.5 mg/dL (ref 0.2–1.2)
BUN: 12 mg/dL (ref 6–23)
CALCIUM: 10 mg/dL (ref 8.4–10.5)
CO2: 30 mEq/L (ref 19–32)
Chloride: 102 mEq/L (ref 96–112)
Creatinine, Ser: 0.83 mg/dL (ref 0.40–1.20)
GFR: 91.32 mL/min (ref >60.00)
Glucose, Bld: 60 mg/dL — ABNORMAL LOW (ref 70–99)
POTASSIUM: 3.8 meq/L (ref 3.5–5.1)
SODIUM: 140 meq/L (ref 135–145)
TOTAL PROTEIN: 7.1 g/dL (ref 6.0–8.3)

## 2016-04-11 LAB — LIPID PANEL
CHOL/HDL RATIO: 3
Cholesterol: 154 mg/dL (ref 0–200)
HDL: 54.3 mg/dL (ref >39.00)
LDL Cholesterol: 83 mg/dL (ref 0–99)
NONHDL: 99.67
Triglycerides: 84 mg/dL (ref 0.0–149.0)
VLDL: 16.8 mg/dL (ref 0.0–40.0)

## 2016-04-11 LAB — TSH: TSH: 0.92 u[IU]/mL (ref 0.35–4.50)

## 2016-04-11 LAB — CBC
HEMATOCRIT: 37.6 % (ref 36.0–46.0)
Hemoglobin: 12.7 g/dL (ref 12.0–15.0)
MCHC: 33.7 g/dL (ref 30.0–36.0)
MCV: 85.5 fl (ref 78.0–100.0)
Platelets: 214 10*3/uL (ref 150.0–400.0)
RBC: 4.4 Mil/uL (ref 3.87–5.11)
RDW: 12.7 % (ref 11.5–15.5)
WBC: 6.5 10*3/uL (ref 4.0–10.5)

## 2016-04-11 MED ORDER — MELOXICAM 15 MG PO TABS: ORAL_TABLET | ORAL | Status: DC

## 2016-04-11 MED FILL — MELOXICAM 15 MG TABLET: 15 | 45 days supply | Qty: 45 | Fill #0

## 2016-04-11 NOTE — Assessment & Plan Note (Signed)
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 We will try conservative measures and see her back in 1 month. If continues would at least get 4 view x-rays standing with tunnel view to evaluate for underlying etiologies. As well getting systemic lab work including inflammatory markers, STD testing or possibly being able to drain the knee if it is inflamed and sent for gonococcal cultures would be reasonable. I highly doubt this is the case but would keep this in the differential. -In the interim start Mobic 15 mg daily for the next month. VMO strengthening exercises explained. Ice after activity.

## 2016-04-11 NOTE — Progress Notes (Signed)
  Mariajulia Whittington - 20 y.o. female MRN [id number]  Date of birth: [birth year]  SUBJECTIVE:  Including CC & ROS.  Momina Nienaber Murgia is a 20 y.o. female who presents today for R knee pain.    Knee Pain R, initial visit 04/11/16 - patient presents today with 2-3 weeks of right anterior knee pain. Denies a specific injury or previously injuring the knee. She denies any locking catching giving way or instability. Worse after activity or at the end of the day when she is been on her feet. Worse after going up or down stairs or sitting for prolonged periods of time. Ibuprofen and ice help. Denies any fevers chills or weight loss or night sweats.  PMHx - Updated and reviewed.  Contributory factors include: Anemia PSHx - Updated and reviewed.  Contributory factors include:  Open reduction internal fixation wrist fracture FHx - Updated and reviewed.  Contributory factors include:  Thyroid cancer maternal Social Hx - Updated and reviewed. Contributory factors include: Nonsmoker  Medications - reviewed   12 point ROS negative other than per HPI.   Exam:  Filed Vitals:   04/11/16 1033  BP: 111/60  Pulse: 81   Gen: NAD, AAO 3 Cardio- RRR Pulm - Normal respiratory effort/rate Skin: No rashes or erythema Extremities: No edema  Vascular: pulses +2 bilateral upper and lower extremity Psych: Normal affect   R Knee:  Normal to inspection with no erythema or effusion or obvious bony abnormalities.  No obvious Baker's cysts Palpation normal with no warmth or joint line tenderness or patellar tenderness or condyle tenderness.  No TTP along infrapatellar or pes anserine bursas.   ROM normal in flexion (135 degrees) and extension (0 degrees) and lower leg rotation. Ligaments with solid consistent endpoints including ACL, PCL, LCL, MCL.  Negative Anterior Drawer/Lachman/Pivot Shift Negative Mcmurray's and provocative meniscal tests including Thessaly and Apley compression testing  +painful patellar  compression with +J sign.  Patellar and quadriceps tendons unremarkable. Hamstring and quadriceps strength is normal.  Neurovascularly intact B/L LE

## 2016-04-12 LAB — RPR

## 2016-04-12 LAB — HIV ANTIBODY (ROUTINE TESTING W REFLEX): HIV: NONREACTIVE

## 2016-04-15 LAB — URINE CYTOLOGY ANCILLARY ONLY
Chlamydia: NEGATIVE
Neisseria Gonorrhea: NEGATIVE
Trichomonas: NEGATIVE

## 2016-04-18 ENCOUNTER — Encounter: Payer: Self-pay | Admitting: Family Medicine

## 2016-05-07 ENCOUNTER — Ambulatory Visit: Payer: 59 | Admitting: Sports Medicine

## 2016-05-13 ENCOUNTER — Ambulatory Visit: Payer: 59 | Admitting: Sports Medicine

## 2016-06-10 DIAGNOSIS — R635 Abnormal weight gain: Secondary | ICD-10-CM | POA: Diagnosis not present

## 2016-06-10 DIAGNOSIS — Z6826 Body mass index (BMI) 26.0-26.9, adult: Secondary | ICD-10-CM | POA: Diagnosis not present

## 2016-06-26 DIAGNOSIS — Z6826 Body mass index (BMI) 26.0-26.9, adult: Secondary | ICD-10-CM | POA: Diagnosis not present

## 2016-06-26 DIAGNOSIS — Z01419 Encounter for gynecological examination (general) (routine) without abnormal findings: Secondary | ICD-10-CM | POA: Diagnosis not present

## 2016-06-26 MED FILL — LO LOESTRIN FE 1-10 TABLET: 1 MG-10 MCG | 84 days supply | Qty: 84 | Fill #0 | Status: TO

## 2016-07-16 MED FILL — FERRAPLUS 90 TABLET: 90-1 | 90 days supply | Qty: 90 | Fill #0

## 2016-08-12 MED FILL — PREVIDENT 5000 BOOSTER PLUS: 1.1 | 30 days supply | Qty: 100 | Fill #0

## 2016-09-09 DIAGNOSIS — F419 Anxiety disorder, unspecified: Secondary | ICD-10-CM | POA: Diagnosis not present

## 2016-09-09 DIAGNOSIS — Z79899 Other long term (current) drug therapy: Secondary | ICD-10-CM | POA: Diagnosis not present

## 2016-09-09 DIAGNOSIS — F902 Attention-deficit hyperactivity disorder, combined type: Secondary | ICD-10-CM | POA: Diagnosis not present

## 2016-09-09 MED FILL — VYVANSE 50 MG CAPSULE: 50 | 90 days supply | Qty: 90 | Fill #0

## 2016-09-23 ENCOUNTER — Telehealth: Payer: Self-pay | Admitting: Family Medicine

## 2016-09-23 DIAGNOSIS — Z789 Other specified health status: Secondary | ICD-10-CM

## 2016-09-23 NOTE — Telephone Encounter (Signed)
Yes

## 2016-09-23 NOTE — Telephone Encounter (Signed)
Pt has been scheduled, can you put the order in? Thanks!

## 2016-09-23 NOTE — Telephone Encounter (Signed)
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

## 2016-09-24 ENCOUNTER — Other Ambulatory Visit: Payer: 59

## 2016-09-24 ENCOUNTER — Other Ambulatory Visit (INDEPENDENT_AMBULATORY_CARE_PROVIDER_SITE_OTHER): Payer: 59

## 2016-09-24 DIAGNOSIS — Z1159 Encounter for screening for other viral diseases: Secondary | ICD-10-CM

## 2016-09-25 LAB — HEPATITIS B SURFACE ANTIBODY, QUANTITATIVE: HEPATITIS B-POST: 75.3 m[IU]/mL

## 2016-10-31 MED FILL — LO LOESTRIN FE 1-10 TABLET: 1 MG-10 MCG | 84 days supply | Qty: 84 | Fill #0

## 2016-10-31 MED FILL — FERRAPLUS 90 TABLET: 90-1 | 90 days supply | Qty: 90 | Fill #1

## 2016-12-11 DIAGNOSIS — Z0100 Encounter for examination of eyes and vision without abnormal findings: Secondary | ICD-10-CM | POA: Diagnosis not present

## 2017-01-09 MED FILL — LO LOESTRIN FE 1-10 TABLET: 1 MG-10 MCG | 84 days supply | Qty: 84 | Fill #0

## 2017-01-29 DIAGNOSIS — F419 Anxiety disorder, unspecified: Secondary | ICD-10-CM | POA: Diagnosis not present

## 2017-01-29 DIAGNOSIS — F902 Attention-deficit hyperactivity disorder, combined type: Secondary | ICD-10-CM | POA: Diagnosis not present

## 2017-01-29 DIAGNOSIS — Z79899 Other long term (current) drug therapy: Secondary | ICD-10-CM | POA: Diagnosis not present

## 2017-03-09 MED FILL — VYVANSE 50 MG CAPSULE: 50 | 30 days supply | Qty: 30 | Fill #0

## 2017-04-02 MED FILL — LO LOESTRIN FE 1-10 TABLET: 1 MG-10 MCG | 84 days supply | Qty: 84 | Fill #1

## 2017-04-30 ENCOUNTER — Ambulatory Visit (INDEPENDENT_AMBULATORY_CARE_PROVIDER_SITE_OTHER): Payer: 59 | Admitting: Family Medicine

## 2017-04-30 ENCOUNTER — Encounter: Payer: Self-pay | Admitting: Family Medicine

## 2017-04-30 VITALS — BP 108/68 | HR 68 | Temp 98.5°F | Wt 178.2 lb

## 2017-04-30 DIAGNOSIS — F1920 Other psychoactive substance dependence, uncomplicated: Secondary | ICD-10-CM | POA: Diagnosis not present

## 2017-04-30 DIAGNOSIS — F419 Anxiety disorder, unspecified: Secondary | ICD-10-CM | POA: Diagnosis not present

## 2017-04-30 DIAGNOSIS — Z79899 Other long term (current) drug therapy: Secondary | ICD-10-CM | POA: Diagnosis not present

## 2017-04-30 DIAGNOSIS — F902 Attention-deficit hyperactivity disorder, combined type: Secondary | ICD-10-CM | POA: Diagnosis not present

## 2017-04-30 DIAGNOSIS — J069 Acute upper respiratory infection, unspecified: Secondary | ICD-10-CM

## 2017-04-30 DIAGNOSIS — Z23 Encounter for immunization: Secondary | ICD-10-CM | POA: Diagnosis not present

## 2017-04-30 NOTE — Patient Instructions (Addendum)
It was a pleasure to see you today! Your symptoms today are most likely caused by viral illness. Please drink plenty of water enough for your urine to be pale yellow or clear. You may use Tylenol 325 mg every 6 hours or ibuprofen 600 mg every 6 hours as needed with food. Mucinex DM can be used for cough. Follow-up for evaluation if your symptoms do not improve in 3-4 days, worsen, or you develop a fever greater than 100.  Please consider using nasal saline rinses which can improve symptoms. Good luck with nursing school!    Upper Respiratory Infection, Adult Most upper respiratory infections (URIs) are caused by a virus. A URI affects the nose, throat, and upper air passages. The most common type of URI is often called "the common cold." Follow these instructions at home:  Take medicines only as told by your doctor.  Gargle warm saltwater or take cough drops to comfort your throat as told by your doctor.  Use a warm mist humidifier or inhale steam from a shower to increase air moisture. This may make it easier to breathe.  Drink enough fluid to keep your pee (urine) clear or pale yellow.  Eat soups and other clear broths.  Have a healthy diet.  Rest as needed.  Go back to work when your fever is gone or your doctor says it is okay. ? You may need to stay home longer to avoid giving your URI to others. ? You can also wear a face mask and wash your hands often to prevent spread of the virus.  Use your inhaler more if you have asthma.  Do not use any tobacco products, including cigarettes, chewing tobacco, or electronic cigarettes. If you need help quitting, ask your doctor. Contact a doctor if:  You are getting worse, not better.  Your symptoms are not helped by medicine.  You have chills.  You are getting more short of breath.  You have brown or red mucus.  You have yellow or brown discharge from your nose.  You have pain in your face, especially when you bend  forward.  You have a fever.  You have puffy (swollen) neck glands.  You have pain while swallowing.  You have white areas in the back of your throat. Get help right away if:  You have very bad or constant: ? Headache. ? Ear pain. ? Pain in your forehead, behind your eyes, and over your cheekbones (sinus pain). ? Chest pain.  You have long-lasting (chronic) lung disease and any of the following: ? Wheezing. ? Long-lasting cough. ? Coughing up blood. ? A change in your usual mucus.  You have a stiff neck.  You have changes in your: ? Vision. ? Hearing. ? Thinking. ? Mood. This information is not intended to replace advice given to you by your health care provider. Make sure you discuss any questions you have with your health care provider. Document Released: 04/07/2008 Document Revised: 06/22/2016 Document Reviewed: 01/25/2014 Elsevier Interactive Patient Education  2018 [employer].

## 2017-04-30 NOTE — Progress Notes (Signed)
Patient ID: Holly Delgado, female   DOB: 02-20-1996, 21 y.o.   MRN: [id number]  PCP: Marin Olp, MD  Subjective:  Holly Delgado is a 21 y.o. year old very pleasant female patient who presents with Upper Respiratory infection symptoms including nasal congestion, sinus pressure, cough that is nonproductive -started: two weeks ago, symptoms are improving -previous treatments: Dayquil/Nyquil, nasocort  -sick contacts/travel/risks: denies flu exposure; no recent sick contact use -Hx of: allergies No recent antibiotic use  ROS-denies fever, SOB, NVD, tooth pain  Pertinent Past Medical History- Anemia  Medications- reviewed  Current Outpatient Prescriptions  Medication Sig Dispense Refill  . Iron-Folic [id number] (FERRAPLUS 90) 90-1 MG TABS Take 1 tablet by mouth daily.  3  . Norethindrone-Ethinyl Estradiol-Fe Biphas (LO LOESTRIN FE) 1 MG-10 MCG / 10 MCG tablet Take 1 tablet by mouth daily.     No current facility-administered medications for this visit.     Objective: BP 108/68 (BP Location: Left Arm, Patient Position: Sitting, Cuff Size: Normal)   Pulse 68   Temp 98.5 F (36.9 C) (Oral)   Wt 178 lb 3.2 oz (80.8 kg)   SpO2 98%   BMI 26.32 kg/m  Gen: NAD, resting comfortably HEENT: Oropharynx clear and moist; no tonsilar exudate or edema, no sinus tenderness CV: RRR no murmurs rubs or gallops Lungs: CTAB no crackles, wheeze, rhonchi Abdomen: soft/nontender/nondistended/normal bowel sounds. No rebound or guarding.  Ext: no edema Skin: warm, dry, no rash Neuro: grossly normal, moves all extremities  Assessment/Plan: Upper Respiratory infection History and exam today are suggestive of viral infection most likely due to upper respiratory infection. Symptomatic treatment with: ibuprofen, nasacort, and add either Allegra, Claritin, or Zyrtec.   We discussed that we did not find any infection that had higher probability of being bacterial such as pneumonia  or strep throat. We discussed signs that bacterial infection may have developed particularly fever or shortness of breath. Likely course of 1-2 weeks. Patient is contagious and advised good handwashing and consideration of mask If going to be in public places.   Finally, we reviewed reasons to return to care including if symptoms worsen or persist or new concerns arise- once again particularly shortness of breath or fever.   Laurita Quint, FNP

## 2017-06-08 MED FILL — SF 5000 PLUS CREAM: 1.1 | 30 days supply | Qty: 51 | Fill #0

## 2017-06-18 ENCOUNTER — Encounter: Payer: 59 | Admitting: Family Medicine

## 2017-06-25 DIAGNOSIS — N398 Other specified disorders of urinary system: Secondary | ICD-10-CM | POA: Diagnosis not present

## 2017-06-25 DIAGNOSIS — N390 Urinary tract infection, site not specified: Secondary | ICD-10-CM | POA: Diagnosis not present

## 2017-07-10 DIAGNOSIS — Z23 Encounter for immunization: Secondary | ICD-10-CM | POA: Diagnosis not present

## 2017-07-14 MED FILL — VYVANSE 50 MG CAPSULE: 50 | 90 days supply | Qty: 90 | Fill #0

## 2017-07-14 MED FILL — FERRAPLUS 90 TABLET: 90-1 | 90 days supply | Qty: 90 | Fill #2

## 2017-07-14 MED FILL — LO LOESTRIN FE 1-10 TABLET: 1 MG-10 MCG | 84 days supply | Qty: 84 | Fill #0

## 2017-08-20 DIAGNOSIS — F419 Anxiety disorder, unspecified: Secondary | ICD-10-CM | POA: Diagnosis not present

## 2017-08-20 DIAGNOSIS — F902 Attention-deficit hyperactivity disorder, combined type: Secondary | ICD-10-CM | POA: Diagnosis not present

## 2017-08-20 DIAGNOSIS — Z79899 Other long term (current) drug therapy: Secondary | ICD-10-CM | POA: Diagnosis not present

## 2017-09-04 ENCOUNTER — Telehealth: Payer: Self-pay | Admitting: Family Medicine

## 2017-09-04 NOTE — Telephone Encounter (Signed)
error 

## 2017-09-11 ENCOUNTER — Ambulatory Visit (INDEPENDENT_AMBULATORY_CARE_PROVIDER_SITE_OTHER): Payer: 59 | Admitting: Family Medicine

## 2017-09-11 ENCOUNTER — Encounter: Payer: Self-pay | Admitting: Family Medicine

## 2017-09-11 VITALS — BP 114/68 | HR 98 | Temp 98.0°F | Ht 69.75 in | Wt 186.0 lb

## 2017-09-11 DIAGNOSIS — Z0000 Encounter for general adult medical examination without abnormal findings: Secondary | ICD-10-CM | POA: Diagnosis not present

## 2017-09-11 DIAGNOSIS — F988 Other specified behavioral and emotional disorders with onset usually occurring in childhood and adolescence: Secondary | ICD-10-CM | POA: Insufficient documentation

## 2017-09-11 DIAGNOSIS — Z0184 Encounter for antibody response examination: Secondary | ICD-10-CM | POA: Diagnosis not present

## 2017-09-11 NOTE — Patient Instructions (Signed)
Great to see you! Hope you have a great trip to [place]  Wt Readings from Last 3 Encounters:  09/11/17 186 lb (84.4 kg)  04/30/17 178 lb 3.2 oz (80.8 kg)  04/11/16 165 lb (74.8 kg)  Would watch the weight trend over next few years- perhaps set a goal of 10 lbs down over next 6 months to start.

## 2017-09-11 NOTE — Progress Notes (Signed)
Phone: [phone]  Subjective:  Patient presents today for their annual physical. Chief complaint-noted.   See problem oriented charting- ROS- full  review of systems was completed and negative except for: weight gain- see wweight trend below  The following were reviewed and entered/updated in epic: Past Medical History:  Diagnosis Date  . Anemia    improved on birth control   Patient Active Problem List   Diagnosis Date Noted  . ADD (attention deficit disorder) 09/11/2017    Priority: Medium  . Anemia     Priority: Medium  . Right anterior knee pain 04/11/2016   Past Surgical History:  Procedure Laterality Date  . arm surgery     2006 and 2007- rods after fx  . TONSILLECTOMY  2001  . WISDOM TOOTH EXTRACTION  2013    Family History  Problem Relation Age of Onset  . Thyroid cancer Mother   . Polycystic ovary syndrome Sister   . Breast cancer Maternal Grandmother     Medications- reviewed and updated Current Outpatient Medications  Medication Sig Dispense Refill  . Iron-Folic [id number] (FERRAPLUS 90) 90-1 MG TABS Take 1 tablet by mouth daily.  3  . Norethindrone-Ethinyl Estradiol-Fe Biphas (LO LOESTRIN FE) 1 MG-10 MCG / 10 MCG tablet Take 1 tablet by mouth daily.    Marland Kitchen VYVANSE 50 MG capsule Take 50 mg daily by mouth.     No current facility-administered medications for this visit.     Allergies-reviewed and updated No Known Allergies  Social History   Socioeconomic History  . Marital status: Single    Spouse name: None  . Number of children: None  . Years of education: None  . Highest education level: None  Social Needs  . Financial resource strain: None  . Food insecurity - worry: None  . Food insecurity - inability: None  . Transportation needs - medical: None  . Transportation needs - non-medical: None  Occupational History  . None  Tobacco Use  . Smoking status: Never Smoker  . Smokeless tobacco: Never Used  Substance and Sexual  Activity  . Alcohol use: Yes    Alcohol/week: 0.6 - 1.2 oz    Types: 1 - 2 Standard drinks or equivalent per week  . Drug use: No  . Sexual activity: Yes    Birth control/protection: Pill  Other Topics Concern  . None  Social History Narrative   Parents are patients at [employer]- unclear which physician.    Single/dating. Lives with roommate in [place] then moving back home.       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 Oropharynx normal Neck: no thyromegaly CV: RRR no murmurs rubs or gallops Lungs: CTAB no crackles, wheeze, rhonchi Abdomen: soft/nontender/nondistended/normal bowel sounds. No rebound or guarding.  Ext: no edema Skin: warm, dry Neuro: grossly normal, moves all extremities, PERRLA  Assessment/Plan:  21 y.o. female presenting for annual physical.  Health Maintenance counseling: 1. Anticipatory guidance: Patient counseled regarding  regular dental exams -q6 months, eye exams -yearly visits for contacts, wearing seatbelts.  2. Risk factor reduction:  Advised patient of need for regular exercise and diet rich and fruits and vegetables to reduce risk of heart attack and stroke. Exercise- has really dropped down with school-discussed 150 minutes a week exercise. Diet-feels like doing ok on food choices- did whole 30 and has been eating better since then. Set goal of 10 lbs in 6 months.  Wt Readings from Last 3 Encounters:  09/11/17 186 lb (84.4 kg)  04/30/17 178 lb 3.2 oz (80.8 kg)  04/11/16 165 lb (74.8 kg)  3.  Immunizations/screenings/ancillary studies Immunization History  Administered Date(s) Administered  . Influenza-Unspecified 08/03/2017  . Tdap 04/30/2017  4. Cervical cancer screening- 05/28/15 with 3 year repeat. Though transformation zone was absent. Going for labs and pap with GYN on 24th of December. Asked her to have them send [place] records.  5. Breast cancer screening-  breast exam with GYN and mammogram - start age 64 yearly with MGM with breast cancer in 109s 6. Colon cancer screening - no family history, start at age 34-50 7. Skin cancer screening- advised regular sunscreen use. Denies worrisome, changing, or new skin lesions.  8. Birth control/STD check- birth control pill  Status of chronic or acute concerns   She states gets labs at [facility]. Mom with thyroid cancer history- may request this  Going to [place] this December and needs letter stating medically fit for trip. Will be there for 2 weeks. Declines typhoid. Has been there week prior and no issues- knows to be cautious with eating drinking. Hep A had in 2008 and 2009. Letter written  ADD- treated with vyvanse 50mg  daily  Meds ordered this encounter  Medications  . VYVANSE 50 MG capsule    Sig: Take 50 mg daily by mouth.   Return precautions advised.  Garret Reddish, MD

## 2017-09-25 MED FILL — LO LOESTRIN FE 1-10 TABLET: 1 MG-10 MCG | 84 days supply | Qty: 84 | Fill #0

## 2017-10-26 DIAGNOSIS — Z6826 Body mass index (BMI) 26.0-26.9, adult: Secondary | ICD-10-CM | POA: Diagnosis not present

## 2017-10-26 DIAGNOSIS — Z01419 Encounter for gynecological examination (general) (routine) without abnormal findings: Secondary | ICD-10-CM | POA: Diagnosis not present

## 2017-10-26 LAB — HM PAP SMEAR: HM Pap smear: POSITIVE

## 2017-10-26 LAB — TSH: TSH: 1.13 (ref 0.41–5.90)

## 2017-10-26 LAB — CBC AND DIFFERENTIAL
HEMATOCRIT: 40 (ref 36–46)
HEMOGLOBIN: 13.3 (ref 12.0–16.0)
PLATELETS: 231 (ref 150–399)
WBC: 5.6

## 2017-10-26 LAB — HEPATIC FUNCTION PANEL
ALK PHOS: 50 (ref 25–125)
ALT: 11 (ref 7–35)
AST: 16 (ref 13–35)
Bilirubin, Total: 0.4

## 2017-10-26 LAB — HEMOGLOBIN A1C: Hemoglobin A1C: 4.9

## 2017-10-26 LAB — BASIC METABOLIC PANEL
BUN: 11 (ref 4–21)
Creatinine: 0.9 (ref 0.5–1.1)
Glucose: 84
Potassium: 4 (ref 3.4–5.3)
SODIUM: 138 (ref 137–147)

## 2017-10-26 LAB — LIPID PANEL
CHOLESTEROL: 150 (ref 0–200)
HDL: 55 (ref 35–70)
LDL CALC: 80
TRIGLYCERIDES: 72 (ref 40–160)

## 2017-10-26 LAB — VITAMIN D 25 HYDROXY (VIT D DEFICIENCY, FRACTURES): VIT D 25 HYDROXY: 35

## 2017-11-10 DIAGNOSIS — F902 Attention-deficit hyperactivity disorder, combined type: Secondary | ICD-10-CM | POA: Diagnosis not present

## 2017-11-10 DIAGNOSIS — F419 Anxiety disorder, unspecified: Secondary | ICD-10-CM | POA: Diagnosis not present

## 2017-11-10 DIAGNOSIS — Z79899 Other long term (current) drug therapy: Secondary | ICD-10-CM | POA: Diagnosis not present

## 2017-11-30 DIAGNOSIS — R87610 Atypical squamous cells of undetermined significance on cytologic smear of cervix (ASC-US): Secondary | ICD-10-CM | POA: Diagnosis not present

## 2017-11-30 DIAGNOSIS — N870 Mild cervical dysplasia: Secondary | ICD-10-CM | POA: Diagnosis not present

## 2017-12-11 ENCOUNTER — Encounter: Payer: Self-pay | Admitting: Family Medicine

## 2017-12-11 LAB — T3 UPTAKE
FREE T4: 2.6
T3 UPTAKE: 23
T4 TOTAL: 11.5

## 2017-12-14 DIAGNOSIS — R87810 Cervical high risk human papillomavirus (HPV) DNA test positive: Secondary | ICD-10-CM | POA: Diagnosis not present

## 2017-12-25 DIAGNOSIS — Z0184 Encounter for antibody response examination: Secondary | ICD-10-CM | POA: Diagnosis not present

## 2018-02-02 MED FILL — VYVANSE 50 MG CAPSULE: 50 | 30 days supply | Qty: 30 | Fill #0

## 2018-02-03 MED FILL — LO LOESTRIN FE 1-10 TABLET: 1 MG-10 MCG | 84 days supply | Qty: 84 | Fill #0

## 2018-03-02 DIAGNOSIS — Z0100 Encounter for examination of eyes and vision without abnormal findings: Secondary | ICD-10-CM | POA: Diagnosis not present

## 2018-04-02 MED FILL — PREVIDENT 5000 BOOSTER PLUS: 1.1 | 30 days supply | Qty: 100 | Fill #0

## 2018-04-07 DIAGNOSIS — F4010 Social phobia, unspecified: Secondary | ICD-10-CM | POA: Diagnosis not present

## 2018-04-07 DIAGNOSIS — F419 Anxiety disorder, unspecified: Secondary | ICD-10-CM | POA: Diagnosis not present

## 2018-04-07 DIAGNOSIS — F902 Attention-deficit hyperactivity disorder, combined type: Secondary | ICD-10-CM | POA: Diagnosis not present

## 2018-04-07 DIAGNOSIS — Z79899 Other long term (current) drug therapy: Secondary | ICD-10-CM | POA: Diagnosis not present

## 2018-04-07 MED FILL — VYVANSE 50 MG CAPSULE: 50 | 30 days supply | Qty: 30 | Fill #0

## 2018-05-12 DIAGNOSIS — R87612 Low grade squamous intraepithelial lesion on cytologic smear of cervix (LGSIL): Secondary | ICD-10-CM | POA: Diagnosis not present

## 2018-05-12 DIAGNOSIS — R87610 Atypical squamous cells of undetermined significance on cytologic smear of cervix (ASC-US): Secondary | ICD-10-CM | POA: Diagnosis not present

## 2018-05-13 MED FILL — LO LOESTRIN FE 1-10 TABLET: 1 MG-10 MCG | 84 days supply | Qty: 84 | Fill #0

## 2018-06-01 DIAGNOSIS — D229 Melanocytic nevi, unspecified: Secondary | ICD-10-CM | POA: Diagnosis not present

## 2018-06-01 DIAGNOSIS — L709 Acne, unspecified: Secondary | ICD-10-CM | POA: Diagnosis not present

## 2018-06-01 DIAGNOSIS — L810 Postinflammatory hyperpigmentation: Secondary | ICD-10-CM | POA: Diagnosis not present

## 2018-06-02 MED FILL — TRIAMCINOLONE 0.1% CREAM: 0.1 | 30 days supply | Qty: 80 | Fill #0

## 2018-07-09 DIAGNOSIS — Z79899 Other long term (current) drug therapy: Secondary | ICD-10-CM | POA: Diagnosis not present

## 2018-07-09 DIAGNOSIS — F902 Attention-deficit hyperactivity disorder, combined type: Secondary | ICD-10-CM | POA: Diagnosis not present

## 2018-10-08 DIAGNOSIS — F419 Anxiety disorder, unspecified: Secondary | ICD-10-CM | POA: Diagnosis not present

## 2018-10-08 DIAGNOSIS — Z79899 Other long term (current) drug therapy: Secondary | ICD-10-CM | POA: Diagnosis not present

## 2018-10-08 DIAGNOSIS — F902 Attention-deficit hyperactivity disorder, combined type: Secondary | ICD-10-CM | POA: Diagnosis not present

## 2018-10-18 MED FILL — CONCERTA 54 MG TABLET ER: 54 | 30 days supply | Qty: 30 | Fill #0

## 2018-11-01 DIAGNOSIS — Z01419 Encounter for gynecological examination (general) (routine) without abnormal findings: Secondary | ICD-10-CM | POA: Diagnosis not present

## 2018-11-01 DIAGNOSIS — Z6826 Body mass index (BMI) 26.0-26.9, adult: Secondary | ICD-10-CM | POA: Diagnosis not present

## 2018-11-01 DIAGNOSIS — R87612 Low grade squamous intraepithelial lesion on cytologic smear of cervix (LGSIL): Secondary | ICD-10-CM | POA: Diagnosis not present

## 2018-11-01 MED FILL — LO LOESTRIN FE 1-10 TABLET: 1 MG-10 MCG | 84 days supply | Qty: 84 | Fill #0

## 2018-11-05 MED FILL — FLUCONAZOLE 150 MG TABS: 150 | 1 days supply | Qty: 1 | Fill #0

## 2018-11-08 DIAGNOSIS — J019 Acute sinusitis, unspecified: Secondary | ICD-10-CM | POA: Diagnosis not present

## 2018-11-08 DIAGNOSIS — B9689 Other specified bacterial agents as the cause of diseases classified elsewhere: Secondary | ICD-10-CM | POA: Diagnosis not present

## 2019-02-21 DIAGNOSIS — Z79899 Other long term (current) drug therapy: Secondary | ICD-10-CM | POA: Diagnosis not present

## 2019-02-21 DIAGNOSIS — F902 Attention-deficit hyperactivity disorder, combined type: Secondary | ICD-10-CM | POA: Diagnosis not present

## 2019-02-22 MED FILL — VYVANSE 50 MG CAPSULE: 50 | 30 days supply | Qty: 30 | Fill #0

## 2019-03-31 MED FILL — LO LOESTRIN FE 1-10 TABLET: 1 MG-10 MCG | 84 days supply | Qty: 84 | Fill #1

## 2019-06-24 MED FILL — LO LOESTRIN FE 1-10 TABLET: 1 MG-10 MCG | 84 days supply | Qty: 84 | Fill #2

## 2019-07-18 MED FILL — AMPHETAMINE-DEXTROAMPHETAMI: 10 | 30 days supply | Qty: 30 | Fill #0

## 2019-09-14 MED FILL — LO LOESTRIN FE 1-10 TABLET: 1 MG-10 MCG | 84 days supply | Qty: 84 | Fill #3

## 2019-12-12 MED FILL — LO LOESTRIN FE 1-10 TABLET: 1 MG-10 MCG | 84 days supply | Qty: 84 | Fill #0

## 2022-07-28 ENCOUNTER — Encounter: Payer: Self-pay | Admitting: *Deleted

## 2022-10-28 ENCOUNTER — Other Ambulatory Visit (HOSPITAL_COMMUNITY): Payer: Self-pay

## 2022-10-28 MED ORDER — CIPROFLOXACIN HCL 500 MG PO TABS
500.0000 mg | ORAL_TABLET | Freq: Two times a day (BID) | ORAL | 0 refills | Status: AC
  Filled 2022-10-28: qty 14, 7d supply, fill #0

## 2022-11-24 ENCOUNTER — Other Ambulatory Visit (HOSPITAL_COMMUNITY): Payer: Self-pay

## 2022-11-24 MED ORDER — ZEPBOUND 2.5 MG/0.5ML ~~LOC~~ SOAJ
2.5000 mg | SUBCUTANEOUS | 1 refills | Status: AC
  Filled 2022-11-24 (×2): qty 2, 28d supply, fill #0

## 2022-11-25 ENCOUNTER — Other Ambulatory Visit (HOSPITAL_COMMUNITY): Payer: Self-pay
